# Patient Record
Sex: Male | Born: 1950 | Race: White | Hispanic: No | Marital: Married | Smoking: Never smoker
Health system: Southern US, Community
[De-identification: ages and names within clinical notes are randomized; demographics above are authoritative.]

## PROBLEM LIST (undated history)

## (undated) DIAGNOSIS — I10 Essential (primary) hypertension: Secondary | ICD-10-CM

## (undated) DIAGNOSIS — G4700 Insomnia, unspecified: Secondary | ICD-10-CM

## (undated) DIAGNOSIS — M1990 Unspecified osteoarthritis, unspecified site: Secondary | ICD-10-CM

## (undated) DIAGNOSIS — K219 Gastro-esophageal reflux disease without esophagitis: Secondary | ICD-10-CM

## (undated) DIAGNOSIS — K2270 Barrett's esophagus without dysplasia: Secondary | ICD-10-CM

## (undated) DIAGNOSIS — J302 Other seasonal allergic rhinitis: Secondary | ICD-10-CM

## (undated) DIAGNOSIS — F32A Depression, unspecified: Secondary | ICD-10-CM

## (undated) DIAGNOSIS — F329 Major depressive disorder, single episode, unspecified: Secondary | ICD-10-CM

## (undated) HISTORY — PX: UPPER GI ENDOSCOPY: SHX6162

## (undated) HISTORY — PX: BUNIONECTOMY: SHX129

## (undated) HISTORY — PX: COLONOSCOPY: SHX174

---

## 1993-12-02 HISTORY — PX: ELBOW ARTHROSCOPY: SHX614

## 2000-09-01 ENCOUNTER — Ambulatory Visit (HOSPITAL_COMMUNITY): Admission: RE | Admit: 2000-09-01 | Discharge: 2000-09-01 | Payer: Self-pay | Admitting: *Deleted

## 2003-11-24 ENCOUNTER — Ambulatory Visit (HOSPITAL_COMMUNITY): Admission: RE | Admit: 2003-11-24 | Discharge: 2003-11-24 | Payer: Self-pay | Admitting: *Deleted

## 2005-07-11 ENCOUNTER — Ambulatory Visit: Admission: RE | Admit: 2005-07-11 | Discharge: 2005-07-11 | Payer: Self-pay | Admitting: Internal Medicine

## 2005-07-16 ENCOUNTER — Emergency Department (HOSPITAL_COMMUNITY): Admission: EM | Admit: 2005-07-16 | Discharge: 2005-07-16 | Payer: Self-pay | Admitting: Emergency Medicine

## 2005-07-16 ENCOUNTER — Inpatient Hospital Stay (HOSPITAL_COMMUNITY): Admission: AD | Admit: 2005-07-16 | Discharge: 2005-07-18 | Payer: Self-pay | Admitting: Internal Medicine

## 2005-07-16 ENCOUNTER — Ambulatory Visit: Payer: Self-pay | Admitting: Pulmonary Disease

## 2006-01-22 ENCOUNTER — Ambulatory Visit: Payer: Self-pay | Admitting: Oncology

## 2006-12-30 IMAGING — CR DG CHEST 2V
2 series · 2 of 2 positions shown · non-contrast
Comparison: There are no prior studies currently available for comparison.

CLINICAL DATA: Increased shortness of breath.  Pain with inspiration.  
 CHEST ? TWO VIEW:

[w chest pa]
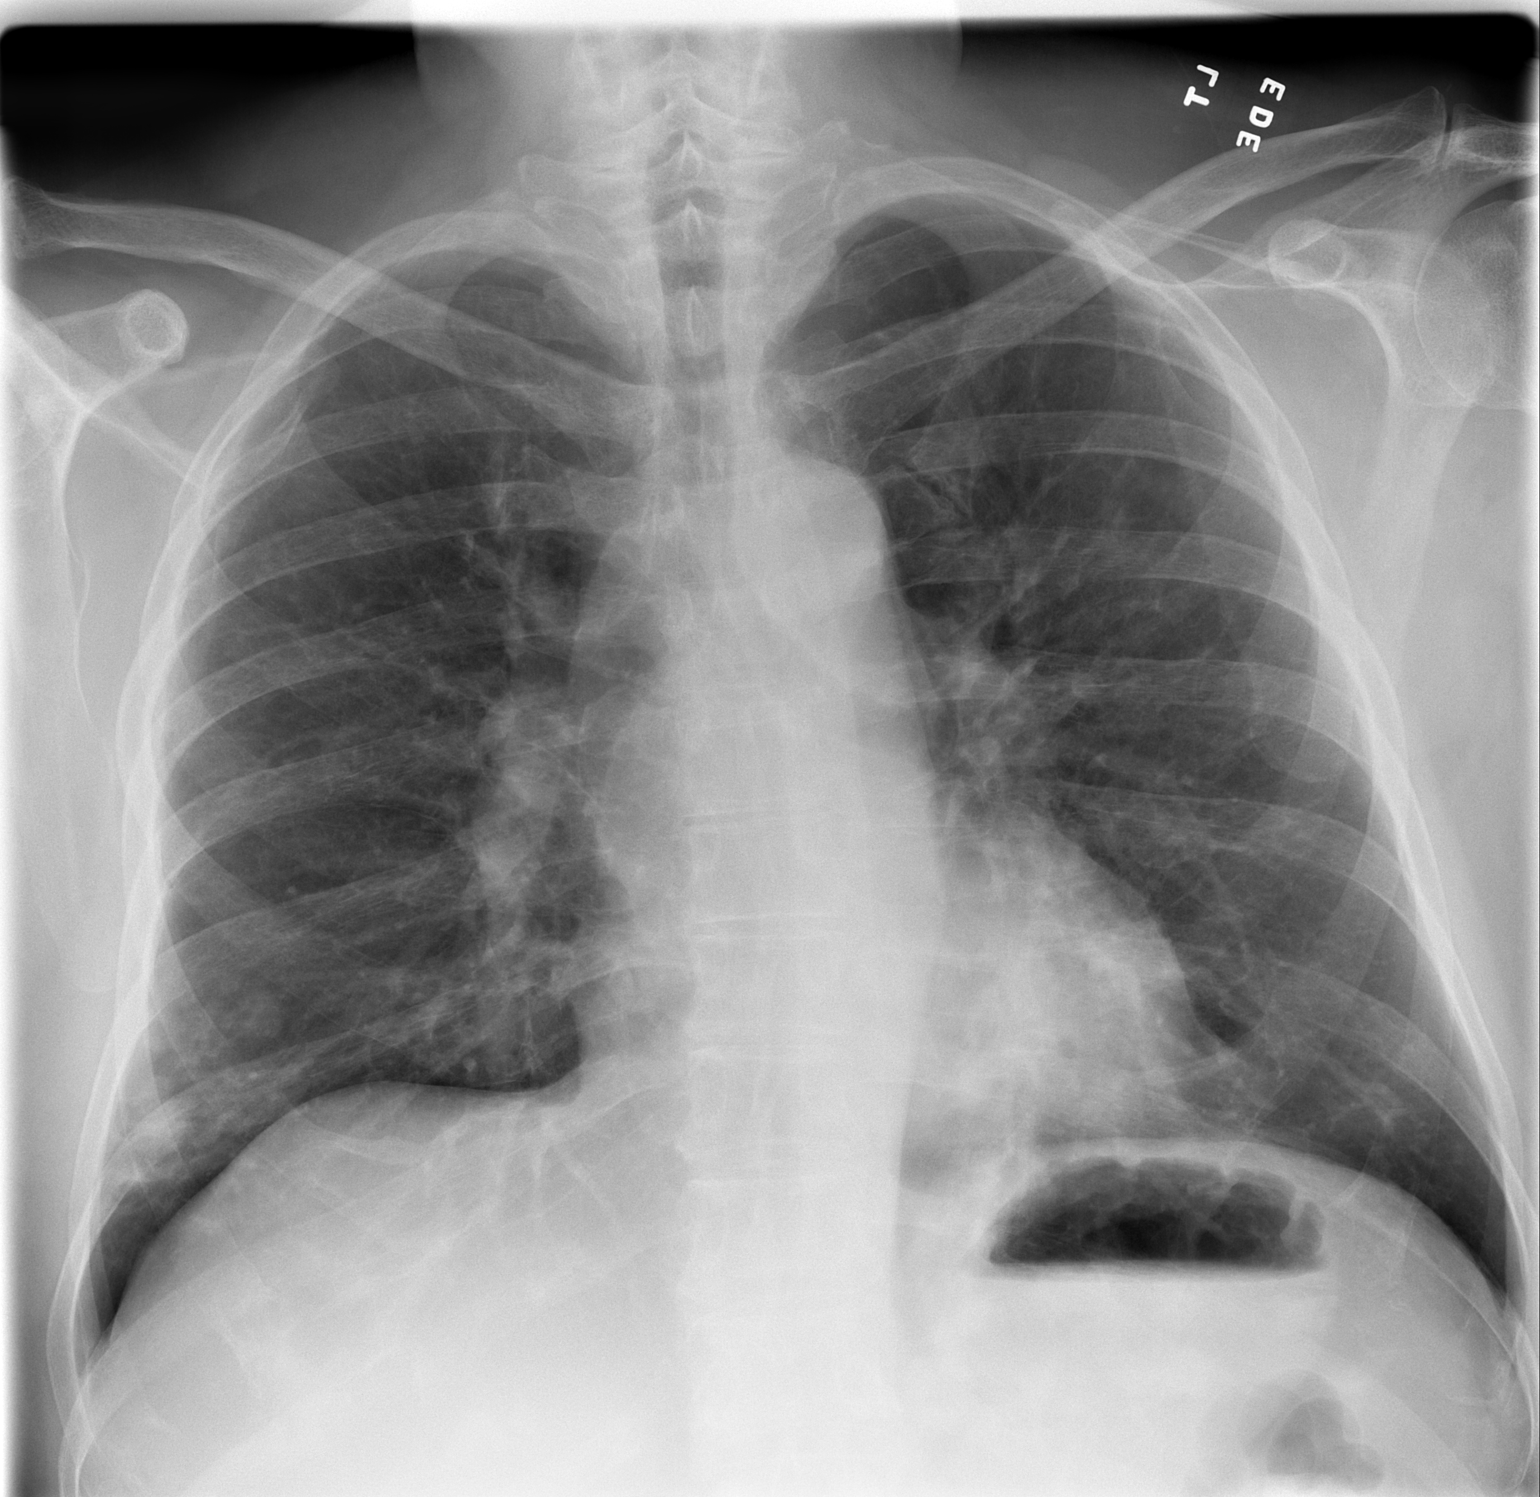

[w chest lat]
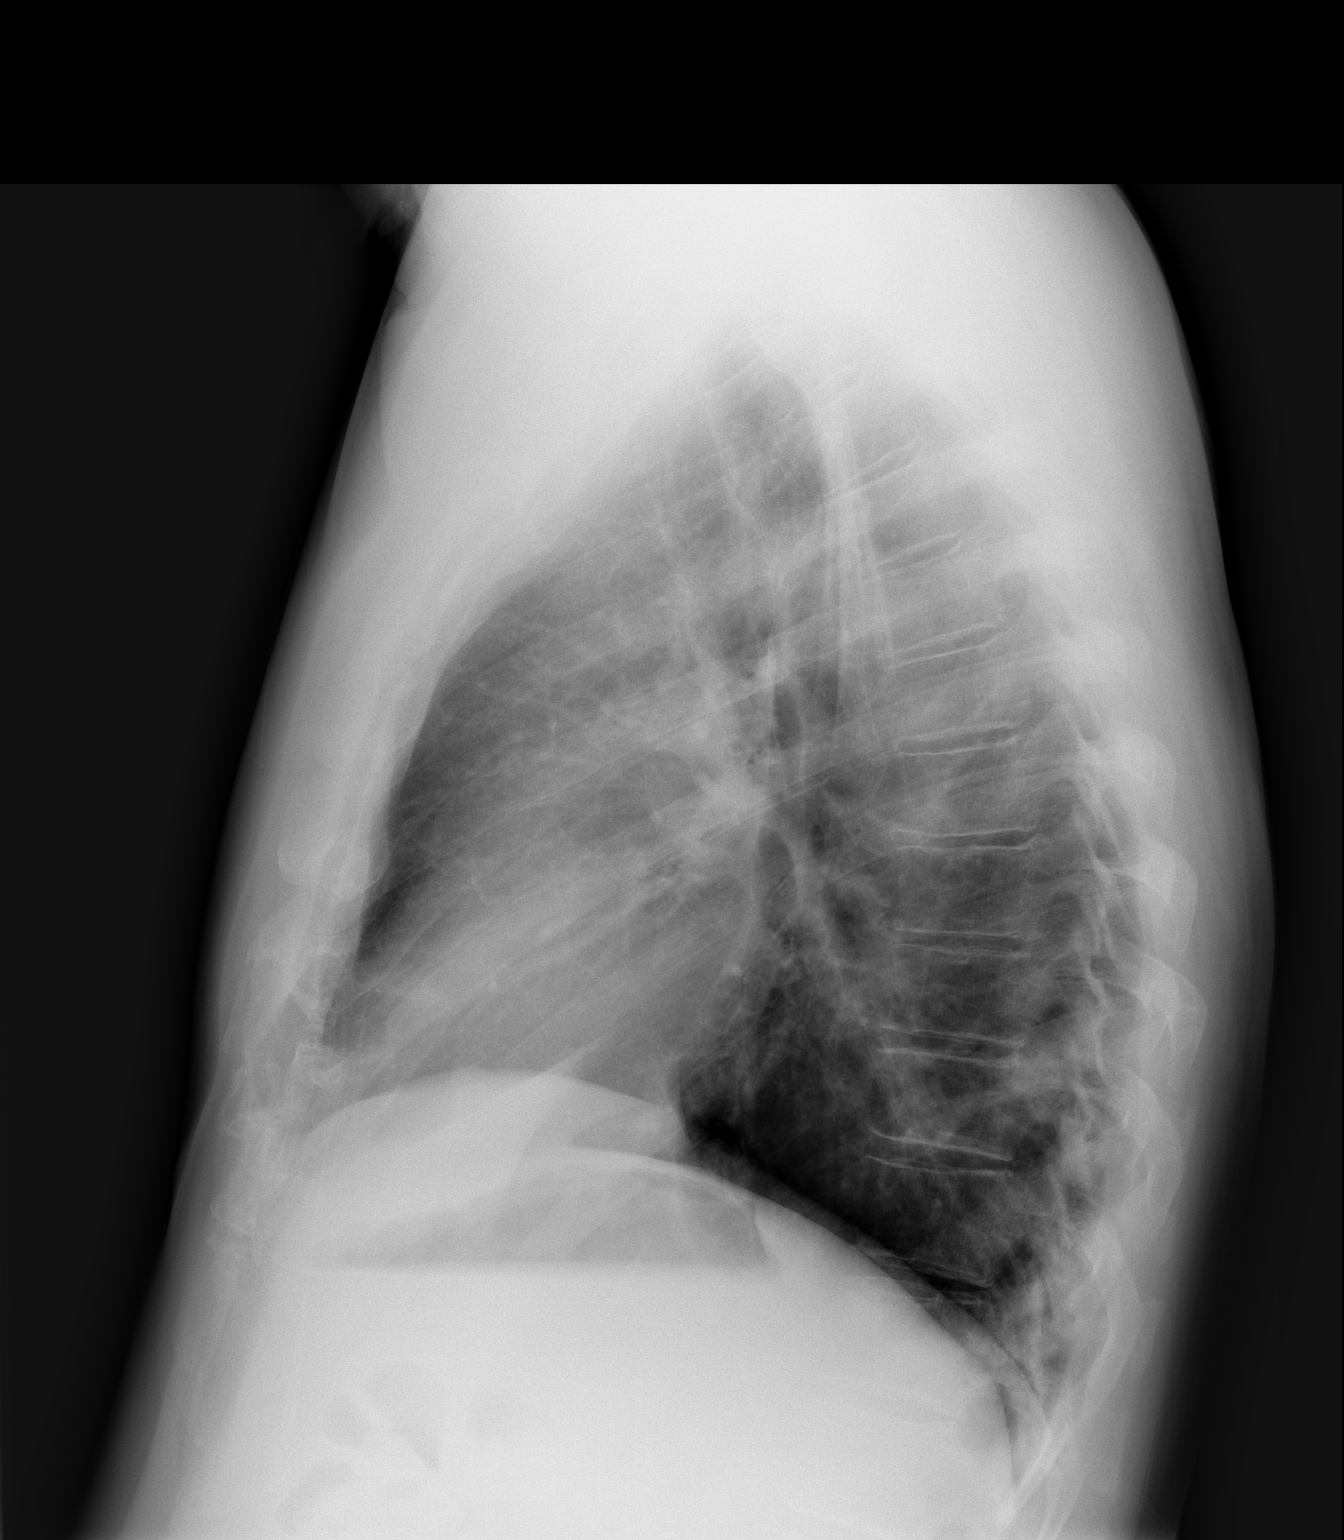

[2 of 2 positions shown; findings below may reference images not displayed]

There are multiple nodular densities seen within the right lung base laterally with adjacent patchy infiltrate.  There is also an infiltrate seen within the left lower lobe in the retrocardiac region.  The right hilum appears slightly prominent.  The heart is upper normal in size.  There is fullness noted in the subcarinal region.  Chest CT scan would be helpful for further evaluation.
IMPRESSION: Multiple small nodules within the right lung base laterally.  There are also bilateral patchy lower lobe infiltrates and questionable fullness within the subcarinal region and right hilum.  Chest CT scan recommended for further evaluation.

## 2012-12-02 HISTORY — PX: FINGER DEBRIDEMENT: SHX1634

## 2013-12-22 ENCOUNTER — Other Ambulatory Visit: Payer: Self-pay | Admitting: Orthopedic Surgery

## 2013-12-22 ENCOUNTER — Encounter (HOSPITAL_BASED_OUTPATIENT_CLINIC_OR_DEPARTMENT_OTHER): Payer: Self-pay | Admitting: *Deleted

## 2013-12-22 NOTE — Progress Notes (Signed)
To come in for ekg-bmethx pneumonia 2006-never smoked-no resp problems

## 2013-12-23 ENCOUNTER — Other Ambulatory Visit: Payer: Self-pay

## 2013-12-23 ENCOUNTER — Encounter (HOSPITAL_BASED_OUTPATIENT_CLINIC_OR_DEPARTMENT_OTHER)
Admission: RE | Admit: 2013-12-23 | Discharge: 2013-12-23 | Disposition: A | Discharge status: Home / Self Care | Payer: BC Managed Care – PPO | Source: Ambulatory Visit | Attending: Orthopedic Surgery | Admitting: Orthopedic Surgery

## 2013-12-23 DIAGNOSIS — Z01810 Encounter for preprocedural cardiovascular examination: Secondary | ICD-10-CM | POA: Insufficient documentation

## 2013-12-23 DIAGNOSIS — Z01818 Encounter for other preprocedural examination: Secondary | ICD-10-CM | POA: Insufficient documentation

## 2013-12-23 LAB — BASIC METABOLIC PANEL
BUN: 15 mg/dL (ref 6–23)
CALCIUM: 9.8 mg/dL (ref 8.4–10.5)
CO2: 24 mEq/L (ref 19–32)
Chloride: 103 mEq/L (ref 96–112)
Creatinine, Ser: 0.68 mg/dL (ref 0.50–1.35)
GLUCOSE: 117 mg/dL — AB (ref 70–99)
Potassium: 5 mEq/L (ref 3.7–5.3)
Sodium: 140 mEq/L (ref 137–147)

## 2013-12-23 NOTE — Progress Notes (Signed)
ekg cleared by dr Sampson Goonfitzgerald

## 2013-12-24 ENCOUNTER — Other Ambulatory Visit: Payer: Self-pay | Admitting: Orthopedic Surgery

## 2013-12-27 NOTE — H&P (Signed)
Jacob Wells is an 63 y.o. male.   Chief Complaint: . He presents c/o a chronically painful left shoulder.  HPI: . He presents for evaluation of a chronically painful left shoulder. He noted to Dr. Stanford ScotlandWarburton that he had significant left shoulder pain that was very sharp and intermittent. Dr. Stanford ScotlandWarburton ordered an MRI which revealed a type II SLAP lesion, AC arthropathy with severe impingement of his distal clavicle osteophyte on the supraspinatus rotator cuff tendon and cystic change at the insertion of the supraspinatus. He has a partial thickness PASTA/PITA tear of his supraspinatus tendon noted. He was advised by Dr. Stanford ScotlandWarburton to live with his symptoms for the time being. He seeks a 2nd opinion with our practice as he is well acquainted with our office.   Past Medical History  Diagnosis Date  . Hypertension   . GERD (gastroesophageal reflux disease)   . Barrett esophagus   . Seasonal allergies   . Insomnia   . Depression   . Arthritis     Past Surgical History  Procedure Laterality Date  . Finger debridement  2014    I/D right index MRSA  . Elbow arthroscopy  1995    right  . Bunionectomy      both feet  . Colonoscopy    . Upper gi endoscopy      History reviewed. No pertinent family history. Social History:  reports that he has never smoked. He does not have any smokeless tobacco history on file. He reports that he does not drink alcohol or use illicit drugs.  Allergies: No Known Allergies  No prescriptions prior to admission    No results found for this or any previous visit (from the past 48 hour(s)).  No results found.   Pertinent items are noted in HPI.  Height 6' (1.829 m), weight 88.451 kg (195 lb).  General appearance: alert Head: Normocephalic, without obvious abnormality Neck: supple, symmetrical, trachea midline Resp: clear to auscultation bilaterally Cardio: regular rate and rhythm GI: normal findings: bowel sounds normal Extremities:  He has  near full ROM of his left shoulder. He has elevation right shoulder 175, left shoulder 170, external rotation right shoulder 95 degrees at 90 degrees abduction and 90 degrees on the left. His internal rotation right shoulder is T8 and left shoulder T8. He can extend his interscapular plane bilaterally. He has pain with rapid abduction, pain with cross torso adduction, pain with Hawkins maneuver and painful Speeds test. Traction on the long head of the biceps is painful.   We reviewed AP, lateral and outlet films in our office which demonstrated a type III acromion, marked narrowing of the [facility] joint, and an inferior projecting osteophyte at the distal clavicle.   I reviewed his MRI pointing out his SLAP lesion, his advanced arthritis at the [facility] joint and cystic change and a partial thickness rotator cuff tear at the supraspinatus insertion.    Pulses: 2+ and symmetric Skin: normal Neurologic: Grossly normal    Assessment/Plan Impression: Chronic and progressive left shoulder pain with impingement.  Plan: To the OR for left SA with SAD/DCR and possible biceps tenotomy and RC repair.The procedure, risks,benefits and post-op course were discussed with the patient at length and they were in agreement with the plan.  Jacob Wells 12/27/2013, 10:27 AM  H&P documentation: 12/28/2013  -History and Physical Reviewed  -Patient has been re-examined  -No change in the plan of care  Jacob Forsterobert V Katharyn Schauer Jr, MD

## 2013-12-28 ENCOUNTER — Encounter (HOSPITAL_BASED_OUTPATIENT_CLINIC_OR_DEPARTMENT_OTHER): Payer: Self-pay

## 2013-12-28 ENCOUNTER — Ambulatory Visit (HOSPITAL_BASED_OUTPATIENT_CLINIC_OR_DEPARTMENT_OTHER): Payer: BC Managed Care – PPO | Admitting: Anesthesiology

## 2013-12-28 ENCOUNTER — Encounter (HOSPITAL_BASED_OUTPATIENT_CLINIC_OR_DEPARTMENT_OTHER): Payer: BC Managed Care – PPO | Admitting: Anesthesiology

## 2013-12-28 ENCOUNTER — Ambulatory Visit (HOSPITAL_BASED_OUTPATIENT_CLINIC_OR_DEPARTMENT_OTHER)
Admission: RE | Admit: 2013-12-28 | Discharge: 2013-12-29 | Disposition: A | Discharge status: Home / Self Care | Payer: BC Managed Care – PPO | Source: Ambulatory Visit | Attending: Orthopedic Surgery | Admitting: Orthopedic Surgery

## 2013-12-28 ENCOUNTER — Encounter (HOSPITAL_BASED_OUTPATIENT_CLINIC_OR_DEPARTMENT_OTHER)
Admission: RE | Disposition: A | Discharge status: Home / Self Care | Payer: Self-pay | Source: Ambulatory Visit | Attending: Orthopedic Surgery

## 2013-12-28 DIAGNOSIS — M898X9 Other specified disorders of bone, unspecified site: Secondary | ICD-10-CM | POA: Insufficient documentation

## 2013-12-28 DIAGNOSIS — S43429A Sprain of unspecified rotator cuff capsule, initial encounter: Secondary | ICD-10-CM | POA: Insufficient documentation

## 2013-12-28 DIAGNOSIS — M67919 Unspecified disorder of synovium and tendon, unspecified shoulder: Secondary | ICD-10-CM | POA: Insufficient documentation

## 2013-12-28 DIAGNOSIS — I10 Essential (primary) hypertension: Secondary | ICD-10-CM | POA: Insufficient documentation

## 2013-12-28 DIAGNOSIS — M75120 Complete rotator cuff tear or rupture of unspecified shoulder, not specified as traumatic: Secondary | ICD-10-CM | POA: Diagnosis present

## 2013-12-28 DIAGNOSIS — F329 Major depressive disorder, single episode, unspecified: Secondary | ICD-10-CM | POA: Insufficient documentation

## 2013-12-28 DIAGNOSIS — K2270 Barrett's esophagus without dysplasia: Secondary | ICD-10-CM | POA: Insufficient documentation

## 2013-12-28 DIAGNOSIS — F3289 Other specified depressive episodes: Secondary | ICD-10-CM | POA: Insufficient documentation

## 2013-12-28 DIAGNOSIS — S46819A Strain of other muscles, fascia and tendons at shoulder and upper arm level, unspecified arm, initial encounter: Secondary | ICD-10-CM | POA: Insufficient documentation

## 2013-12-28 DIAGNOSIS — M19019 Primary osteoarthritis, unspecified shoulder: Secondary | ICD-10-CM | POA: Insufficient documentation

## 2013-12-28 DIAGNOSIS — M6580 Other synovitis and tenosynovitis, unspecified site: Secondary | ICD-10-CM | POA: Insufficient documentation

## 2013-12-28 DIAGNOSIS — Z9109 Other allergy status, other than to drugs and biological substances: Secondary | ICD-10-CM | POA: Insufficient documentation

## 2013-12-28 DIAGNOSIS — X58XXXA Exposure to other specified factors, initial encounter: Secondary | ICD-10-CM | POA: Insufficient documentation

## 2013-12-28 DIAGNOSIS — M719 Bursopathy, unspecified: Secondary | ICD-10-CM | POA: Insufficient documentation

## 2013-12-28 DIAGNOSIS — K219 Gastro-esophageal reflux disease without esophagitis: Secondary | ICD-10-CM | POA: Insufficient documentation

## 2013-12-28 DIAGNOSIS — S43439A Superior glenoid labrum lesion of unspecified shoulder, initial encounter: Secondary | ICD-10-CM | POA: Insufficient documentation

## 2013-12-28 HISTORY — DX: Major depressive disorder, single episode, unspecified: F32.9

## 2013-12-28 HISTORY — DX: Other seasonal allergic rhinitis: J30.2

## 2013-12-28 HISTORY — DX: Essential (primary) hypertension: I10

## 2013-12-28 HISTORY — DX: Unspecified osteoarthritis, unspecified site: M19.90

## 2013-12-28 HISTORY — DX: Gastro-esophageal reflux disease without esophagitis: K21.9

## 2013-12-28 HISTORY — DX: Depression, unspecified: F32.A

## 2013-12-28 HISTORY — PX: SHOULDER ARTHROSCOPY WITH BICEPS TENDON REPAIR: SHX5674

## 2013-12-28 HISTORY — DX: Barrett's esophagus without dysplasia: K22.70

## 2013-12-28 HISTORY — DX: Insomnia, unspecified: G47.00

## 2013-12-28 LAB — POCT HEMOGLOBIN-HEMACUE: HEMOGLOBIN: 16.2 g/dL (ref 13.0–17.0)

## 2013-12-28 SURGERY — SHOULDER ARTHROSCOPY WITH BICEPS TENDON REPAIR
Anesthesia: General | Site: Shoulder | Laterality: Left

## 2013-12-28 MED ORDER — FENTANYL CITRATE 0.05 MG/ML IJ SOLN: 50.0000 ug | INTRAMUSCULAR | Status: DC | PRN

## 2013-12-28 MED ORDER — ONDANSETRON HCL 4 MG/2ML IJ SOLN
INTRAMUSCULAR | Status: DC | PRN
  Administered 2013-12-28: 4 mg via INTRAVENOUS

## 2013-12-28 MED ORDER — CEFAZOLIN SODIUM-DEXTROSE 2-3 GM-% IV SOLR
2.0000 g | Freq: Four times a day (QID) | INTRAVENOUS | Status: AC
  Administered 2013-12-28 – 2013-12-29 (×3): 2 g via INTRAVENOUS

## 2013-12-28 MED ORDER — ZOLPIDEM TARTRATE 10 MG PO TABS
10.0000 mg | ORAL_TABLET | Freq: Every evening | ORAL | Status: DC | PRN
  Administered 2013-12-28: 10 mg via ORAL

## 2013-12-28 MED ORDER — METHOCARBAMOL 500 MG PO TABS: 500.0000 mg | ORAL_TABLET | Freq: Four times a day (QID) | ORAL | Status: DC | PRN

## 2013-12-28 MED ORDER — MIDAZOLAM HCL 2 MG/2ML IJ SOLN
INTRAMUSCULAR | Status: AC
  Filled 2013-12-28: qty 2

## 2013-12-28 MED ORDER — METOCLOPRAMIDE HCL 5 MG/ML IJ SOLN
5.0000 mg | Freq: Three times a day (TID) | INTRAMUSCULAR | Status: DC | PRN
Start: 2013-12-28 — End: 2013-12-29

## 2013-12-28 MED ORDER — SUCCINYLCHOLINE CHLORIDE 20 MG/ML IJ SOLN
INTRAMUSCULAR | Status: DC | PRN
  Administered 2013-12-28: 100 mg via INTRAVENOUS

## 2013-12-28 MED ORDER — OXYCODONE HCL 5 MG/5ML PO SOLN: 5.0000 mg | Freq: Once | ORAL | Status: DC | PRN

## 2013-12-28 MED ORDER — FENTANYL CITRATE 0.05 MG/ML IJ SOLN
50.0000 ug | Freq: Once | INTRAMUSCULAR | Status: AC
  Administered 2013-12-28: 100 ug via INTRAVENOUS

## 2013-12-28 MED ORDER — HYDROMORPHONE HCL PF 1 MG/ML IJ SOLN
INTRAMUSCULAR | Status: AC
  Filled 2013-12-28: qty 1

## 2013-12-28 MED ORDER — CEFAZOLIN SODIUM-DEXTROSE 2-3 GM-% IV SOLR
INTRAVENOUS | Status: AC
  Filled 2013-12-28: qty 50

## 2013-12-28 MED ORDER — MIDAZOLAM HCL 2 MG/2ML IJ SOLN: 1.0000 mg | INTRAMUSCULAR | Status: DC | PRN

## 2013-12-28 MED ORDER — CEFAZOLIN SODIUM-DEXTROSE 2-3 GM-% IV SOLR
2.0000 g | Freq: Once | INTRAVENOUS | Status: AC
  Administered 2013-12-28: 2 g via INTRAVENOUS

## 2013-12-28 MED ORDER — METOPROLOL SUCCINATE ER 50 MG PO TB24
50.0000 mg | ORAL_TABLET | Freq: Every day | ORAL | Status: DC
  Administered 2013-12-28: 50 mg via ORAL

## 2013-12-28 MED ORDER — SODIUM CHLORIDE 0.9 % IV SOLN
INTRAVENOUS | Status: DC
  Administered 2013-12-28: 20 mL/h via INTRAVENOUS

## 2013-12-28 MED ORDER — SODIUM CHLORIDE 0.9 % IR SOLN
Status: DC | PRN
  Administered 2013-12-28: 17000 mL

## 2013-12-28 MED ORDER — OXYCODONE-ACETAMINOPHEN 5-325 MG PO TABS
1.0000 | ORAL_TABLET | ORAL | Status: DC | PRN
  Administered 2013-12-28 – 2013-12-29 (×3): 2 via ORAL
  Filled 2013-12-28 (×3): qty 2

## 2013-12-28 MED ORDER — FENTANYL CITRATE 0.05 MG/ML IJ SOLN
INTRAMUSCULAR | Status: AC
  Filled 2013-12-28: qty 2

## 2013-12-28 MED ORDER — METHOCARBAMOL 100 MG/ML IJ SOLN: 500.0000 mg | Freq: Four times a day (QID) | INTRAVENOUS | Status: DC | PRN

## 2013-12-28 MED ORDER — FENTANYL CITRATE 0.05 MG/ML IJ SOLN
INTRAMUSCULAR | Status: AC
  Filled 2013-12-28: qty 4

## 2013-12-28 MED ORDER — MIDAZOLAM HCL 5 MG/5ML IJ SOLN
INTRAMUSCULAR | Status: DC | PRN
  Administered 2013-12-28 (×2): 1 mg via INTRAVENOUS

## 2013-12-28 MED ORDER — ONDANSETRON HCL 4 MG/2ML IJ SOLN: 4.0000 mg | Freq: Four times a day (QID) | INTRAMUSCULAR | Status: DC | PRN

## 2013-12-28 MED ORDER — PROPOFOL 10 MG/ML IV BOLUS
INTRAVENOUS | Status: DC | PRN
  Administered 2013-12-28: 200 mg via INTRAVENOUS

## 2013-12-28 MED ORDER — CEPHALEXIN 500 MG PO CAPS: 500.0000 mg | ORAL_CAPSULE | Freq: Three times a day (TID) | ORAL | Status: DC

## 2013-12-28 MED ORDER — DEXAMETHASONE SODIUM PHOSPHATE 4 MG/ML IJ SOLN
INTRAMUSCULAR | Status: DC | PRN
  Administered 2013-12-28: 10 mg via INTRAVENOUS

## 2013-12-28 MED ORDER — FENTANYL CITRATE 0.05 MG/ML IJ SOLN
INTRAMUSCULAR | Status: DC | PRN
  Administered 2013-12-28 (×2): 50 ug via INTRAVENOUS

## 2013-12-28 MED ORDER — LIDOCAINE HCL (CARDIAC) 20 MG/ML IV SOLN
INTRAVENOUS | Status: DC | PRN
  Administered 2013-12-28: 100 mg via INTRAVENOUS

## 2013-12-28 MED ORDER — PROPOFOL 10 MG/ML IV BOLUS
INTRAVENOUS | Status: AC
  Filled 2013-12-28: qty 20

## 2013-12-28 MED ORDER — OXYCODONE HCL 5 MG PO TABS: 5.0000 mg | ORAL_TABLET | Freq: Once | ORAL | Status: DC | PRN

## 2013-12-28 MED ORDER — CHLORHEXIDINE GLUCONATE 4 % EX LIQD: 60.0000 mL | Freq: Once | CUTANEOUS | Status: DC

## 2013-12-28 MED ORDER — DEXAMETHASONE SODIUM PHOSPHATE 10 MG/ML IJ SOLN
INTRAMUSCULAR | Status: DC | PRN
  Administered 2013-12-28: 4 mg

## 2013-12-28 MED ORDER — HYDROMORPHONE HCL PF 1 MG/ML IJ SOLN
0.2500 mg | INTRAMUSCULAR | Status: DC | PRN
  Administered 2013-12-28 (×4): 0.5 mg via INTRAVENOUS

## 2013-12-28 MED ORDER — BUPIVACAINE-EPINEPHRINE PF 0.5-1:200000 % IJ SOLN
INTRAMUSCULAR | Status: DC | PRN
  Administered 2013-12-28: 25 mL

## 2013-12-28 MED ORDER — LACTATED RINGERS IV SOLN
INTRAVENOUS | Status: DC
  Administered 2013-12-28 (×2): via INTRAVENOUS

## 2013-12-28 MED ORDER — HYDROMORPHONE HCL PF 1 MG/ML IJ SOLN
0.5000 mg | INTRAMUSCULAR | Status: DC | PRN
  Administered 2013-12-28: 0.5 mg via INTRAVENOUS
  Filled 2013-12-28: qty 1

## 2013-12-28 MED ORDER — SUCCINYLCHOLINE CHLORIDE 20 MG/ML IJ SOLN
INTRAMUSCULAR | Status: AC
Start: 2013-12-28 — End: 2013-12-28
  Filled 2013-12-28: qty 1

## 2013-12-28 MED ORDER — METOCLOPRAMIDE HCL 5 MG PO TABS: 5.0000 mg | ORAL_TABLET | Freq: Three times a day (TID) | ORAL | Status: DC | PRN

## 2013-12-28 MED ORDER — LORATADINE 10 MG PO TABS
10.0000 mg | ORAL_TABLET | Freq: Every day | ORAL | Status: DC
  Administered 2013-12-28: 10 mg via ORAL

## 2013-12-28 MED ORDER — HYDROMORPHONE HCL 2 MG PO TABS: ORAL_TABLET | ORAL | Status: DC

## 2013-12-28 MED ORDER — LAMOTRIGINE 200 MG PO TABS
200.0000 mg | ORAL_TABLET | Freq: Every day | ORAL | Status: DC
  Administered 2013-12-28: 200 mg via ORAL

## 2013-12-28 MED ORDER — ONDANSETRON HCL 4 MG PO TABS: 4.0000 mg | ORAL_TABLET | Freq: Four times a day (QID) | ORAL | Status: DC | PRN

## 2013-12-28 MED ORDER — EPHEDRINE SULFATE 50 MG/ML IJ SOLN
INTRAMUSCULAR | Status: DC | PRN
  Administered 2013-12-28: 15 mg via INTRAVENOUS

## 2013-12-28 MED ORDER — MIDAZOLAM HCL 2 MG/2ML IJ SOLN
1.0000 mg | INTRAMUSCULAR | Status: DC | PRN
  Administered 2013-12-28: 2 mg via INTRAVENOUS

## 2013-12-28 MED ORDER — PROMETHAZINE HCL 25 MG/ML IJ SOLN: 6.2500 mg | INTRAMUSCULAR | Status: DC | PRN

## 2013-12-28 MED ORDER — CHLORHEXIDINE GLUCONATE 4 % EX LIQD
60.0000 mL | Freq: Once | CUTANEOUS | Status: DC
Start: 2013-12-28 — End: 2013-12-28

## 2013-12-28 MED ORDER — ZOLPIDEM TARTRATE 5 MG PO TABS
ORAL_TABLET | ORAL | Status: AC
  Filled 2013-12-28: qty 2

## 2013-12-28 SURGICAL SUPPLY — 78 items
ANCHOR PEEK 4.75X19.1 SWLK C (Anchor) ×3 IMPLANT
BANDAGE ADH SHEER 1  50/CT (GAUZE/BANDAGES/DRESSINGS) IMPLANT
BLADE AVERAGE 25MMX9MM (BLADE)
BLADE AVERAGE 25X9 (BLADE) IMPLANT
BLADE CUTTER MENIS 5.5 (BLADE) ×3 IMPLANT
BLADE SURG 15 STRL LF DISP TIS (BLADE) ×2 IMPLANT
BLADE SURG 15 STRL SS (BLADE) ×4
BUR EGG/OVAL CARBIDE (BURR) IMPLANT
BUR OVAL 6.0 (BURR) ×3 IMPLANT
CANISTER SUCT 3000ML (MISCELLANEOUS) IMPLANT
CANISTER SUCT LVC 12 LTR MEDI- (MISCELLANEOUS) IMPLANT
CANNULA TWIST IN 8.25X7CM (CANNULA) ×3 IMPLANT
CLEANER CAUTERY TIP 5X5 PAD (MISCELLANEOUS) IMPLANT
CLOSURE WOUND 1/2 X4 (GAUZE/BANDAGES/DRESSINGS) ×1
CUTTER MENISCUS  4.2MM (BLADE)
CUTTER MENISCUS 4.2MM (BLADE) IMPLANT
DECANTER SPIKE VIAL GLASS SM (MISCELLANEOUS) IMPLANT
DRAPE INCISE IOBAN 66X45 STRL (DRAPES) ×3 IMPLANT
DRAPE STERI 35X30 U-POUCH (DRAPES) ×3 IMPLANT
DRAPE SURG 17X23 STRL (DRAPES) ×3 IMPLANT
DRAPE U-SHAPE 47X51 STRL (DRAPES) ×3 IMPLANT
DRAPE U-SHAPE 76X120 STRL (DRAPES) ×6 IMPLANT
DURAPREP 26ML APPLICATOR (WOUND CARE) ×3 IMPLANT
ELECT REM PT RETURN 9FT ADLT (ELECTROSURGICAL) ×3
ELECTRODE REM PT RTRN 9FT ADLT (ELECTROSURGICAL) ×1 IMPLANT
GLOVE BIOGEL M STRL SZ7.5 (GLOVE) ×3 IMPLANT
GLOVE BIOGEL PI IND STRL 7.0 (GLOVE) ×2 IMPLANT
GLOVE BIOGEL PI IND STRL 8 (GLOVE) ×2 IMPLANT
GLOVE BIOGEL PI INDICATOR 7.0 (GLOVE) ×4
GLOVE BIOGEL PI INDICATOR 8 (GLOVE) ×4
GLOVE ORTHO TXT STRL SZ7.5 (GLOVE) ×3 IMPLANT
GOWN STRL REUS W/ TWL LRG LVL3 (GOWN DISPOSABLE) ×1 IMPLANT
GOWN STRL REUS W/TWL LRG LVL3 (GOWN DISPOSABLE) ×2
GOWN STRL REUS W/TWL XL LVL4 (GOWN DISPOSABLE) ×6 IMPLANT
MANIFOLD NEPTUNE II (INSTRUMENTS) ×3 IMPLANT
NDL SUT 6 .5 CRC .975X.05 MAYO (NEEDLE) IMPLANT
NEEDLE MAYO TAPER (NEEDLE)
NEEDLE MINI RC 24MM (NEEDLE) IMPLANT
NEEDLE SCORPION (NEEDLE) ×3 IMPLANT
PACK ARTHROSCOPY DSU (CUSTOM PROCEDURE TRAY) ×3 IMPLANT
PACK BASIN DAY SURGERY FS (CUSTOM PROCEDURE TRAY) ×3 IMPLANT
PAD ABD 8X10 STRL (GAUZE/BANDAGES/DRESSINGS) ×3 IMPLANT
PAD CLEANER CAUTERY TIP 5X5 (MISCELLANEOUS)
PASSER SUT SWANSON 36MM LOOP (INSTRUMENTS) IMPLANT
PENCIL BUTTON HOLSTER BLD 10FT (ELECTRODE) IMPLANT
SLEEVE SCD COMPRESS KNEE MED (MISCELLANEOUS) ×3 IMPLANT
SLING ARM LRG ADULT FOAM STRAP (SOFTGOODS) IMPLANT
SLING ARM MED ADULT FOAM STRAP (SOFTGOODS) IMPLANT
SLING ARM XL FOAM STRAP (SOFTGOODS) ×3 IMPLANT
SPONGE GAUZE 4X4 12PLY (GAUZE/BANDAGES/DRESSINGS) ×3 IMPLANT
SPONGE LAP 4X18 X RAY DECT (DISPOSABLE) IMPLANT
STRIP CLOSURE SKIN 1/2X4 (GAUZE/BANDAGES/DRESSINGS) ×2 IMPLANT
SUCTION FRAZIER TIP 10 FR DISP (SUCTIONS) IMPLANT
SUT FIBERWIRE #2 38 T-5 BLUE (SUTURE)
SUT FIBERWIRE 3-0 18 TAPR NDL (SUTURE)
SUT PROLENE 1 CT (SUTURE) IMPLANT
SUT PROLENE 3 0 PS 2 (SUTURE) ×6 IMPLANT
SUT TIGER TAPE 7 IN WHITE (SUTURE) ×3 IMPLANT
SUT VIC AB 0 CT1 27 (SUTURE)
SUT VIC AB 0 CT1 27XBRD ANBCTR (SUTURE) IMPLANT
SUT VIC AB 0 SH 27 (SUTURE) IMPLANT
SUT VIC AB 2-0 SH 27 (SUTURE)
SUT VIC AB 2-0 SH 27XBRD (SUTURE) IMPLANT
SUT VIC AB 3-0 SH 27 (SUTURE)
SUT VIC AB 3-0 SH 27X BRD (SUTURE) IMPLANT
SUTURE FIBERWR #2 38 T-5 BLUE (SUTURE) IMPLANT
SUTURE FIBERWR 3-0 18 TAPR NDL (SUTURE) IMPLANT
SYR 3ML 23GX1 SAFETY (SYRINGE) IMPLANT
SYR BULB 3OZ (MISCELLANEOUS) IMPLANT
TAPE FIBER 2MM 7IN #2 BLUE (SUTURE) IMPLANT
TAPE PAPER 3X10 WHT MICROPORE (GAUZE/BANDAGES/DRESSINGS) ×3 IMPLANT
TOWEL OR 17X24 6PK STRL BLUE (TOWEL DISPOSABLE) ×3 IMPLANT
TUBE CONNECTING 20'X1/4 (TUBING) ×2
TUBE CONNECTING 20X1/4 (TUBING) ×4 IMPLANT
TUBING ARTHROSCOPY IRRIG 16FT (MISCELLANEOUS) ×3 IMPLANT
WAND STAR VAC 90 (SURGICAL WAND) ×3 IMPLANT
WATER STERILE IRR 1000ML POUR (IV SOLUTION) ×3 IMPLANT
YANKAUER SUCT BULB TIP NO VENT (SUCTIONS) IMPLANT

## 2013-12-28 NOTE — Anesthesia Postprocedure Evaluation (Signed)
  Anesthesia Post-op Note  Patient: Jacob Wells  Procedure(s) Performed: Procedure(s): SHOULDER ARTHROSCOPY WITH SUBACROMIAL DECOMPRESSION,DISTAL CLAVICLE RESECTION AND SUBSCAPULARIS REPAIR (Left)  Patient Location: PACU  Anesthesia Type:GA combined with regional for post-op pain  Level of Consciousness: awake and alert   Airway and Oxygen Therapy: Patient Spontanous Breathing  Post-op Pain: mild  Post-op Assessment: Post-op Vital signs reviewed, Patient's Cardiovascular Status Stable, Respiratory Function Stable, Patent Airway, No signs of Nausea or vomiting and Pain level controlled  Post-op Vital Signs: Reviewed and stable  Complications: No apparent anesthesia complications

## 2013-12-28 NOTE — Op Note (Signed)
[id number]  

## 2013-12-28 NOTE — Progress Notes (Signed)
Assisted Dr. Gypsy BalsamKasik with left, interscalene  block. Side rails up, monitors on throughout procedure. See vital signs in flow sheet. Tolerated Procedure well.

## 2013-12-28 NOTE — Anesthesia Preprocedure Evaluation (Signed)
Anesthesia Evaluation  Patient identified by MRN, date of birth, ID band Patient awake    Reviewed: Allergy & Precautions, H&P , NPO status , Patient's Chart, lab work & pertinent test results  Airway Mallampati: I TM Distance: >3 FB Neck ROM: Full    Dental   Pulmonary  breath sounds clear to auscultation        Cardiovascular hypertension, Rhythm:Regular Rate:Normal     Neuro/Psych Depression    GI/Hepatic GERD-  ,  Endo/Other    Renal/GU      Musculoskeletal   Abdominal   Peds  Hematology   Anesthesia Other Findings   Reproductive/Obstetrics                           Anesthesia Physical Anesthesia Plan  ASA: II  Anesthesia Plan: General   Post-op Pain Management:    Induction: Intravenous  Airway Management Planned: Oral ETT  Additional Equipment:   Intra-op Plan:   Post-operative Plan: Extubation in OR  Informed Consent: I have reviewed the patients History and Physical, chart, labs and discussed the procedure including the risks, benefits and alternatives for the proposed anesthesia with the patient or authorized representative who has indicated his/her understanding and acceptance.     Plan Discussed with: CRNA and Surgeon  Anesthesia Plan Comments:         Anesthesia Quick Evaluation

## 2013-12-28 NOTE — Discharge Instructions (Signed)
[facility] Center Instructions °Hand Surgery ° °Wound Care: °Keep your hand elevated above the level of your heart.  Do not allow it to dangle by your side.  Keep the dressing dry and do not remove it unless your doctor advises you to do so.  He will usually change it at the time of your post-op visit.  Moving your fingers is advised to stimulate circulation but will depend on the site of your surgery.  If you have a splint applied, your doctor will advise you regarding movement. ° °Activity: °Do not drive or operate machinery today.  Rest today and then you may return to your normal activity and work as indicated by your physician. ° °Diet:  °Drink liquids today or eat a light diet.  You may resume a regular diet tomorrow.   ° °General expectations: °Pain for two to three days. °Fingers may become slightly swollen. ° °Call your doctor if any of the following occur: °Severe pain not relieved by pain medication. °Elevated temperature. °Dressing soaked with blood. °Inability to move fingers. °White or bluish color to fingers. ° ° °Regional Anesthesia Blocks ° °1. Numbness or the inability to move the "blocked" extremity may last from 3-48 hours after placement. The length of time depends on the medication injected and your individual response to the medication. If the numbness is not going away after 48 hours, call your surgeon. ° °2. The extremity that is blocked will need to be protected until the numbness is gone and the  Strength has returned. Because you cannot feel it, you will need to take extra care to avoid injury. Because it may be weak, you may have difficulty moving it or using it. You may not know what position it is in without looking at it while the block is in effect. ° °3. For blocks in the legs and feet, returning to weight bearing and walking needs to be done carefully. You will need to wait until the numbness is entirely gone and the strength has returned. You should be able to move your leg and foot  normally before you try and bear weight or walk. You will need someone to be with you when you first try to ensure you do not fall and possibly risk injury. ° °4. Bruising and tenderness at the needle site are common side effects and will resolve in a few days. ° °5. Persistent numbness or new problems with movement should be communicated to the surgeon or the [facility] ([phone])/ [facility] ([phone]). ° ° °Post Anesthesia Home Care Instructions ° °Activity: °Get plenty of rest for the remainder of the day. A responsible adult should stay with you for 24 hours following the procedure.  °For the next 24 hours, DO NOT: °-Drive a car °-Operate machinery °-Drink alcoholic beverages °-Take any medication unless instructed by your physician °-Make any legal decisions or sign important papers. ° °Meals: °Start with liquid foods such as gelatin or soup. Progress to regular foods as tolerated. Avoid greasy, spicy, heavy foods. If nausea and/or vomiting occur, drink only clear liquids until the nausea and/or vomiting subsides. Call your physician if vomiting continues. ° °Special Instructions/Symptoms: °Your throat may feel dry or sore from the anesthesia or the breathing tube placed in your throat during surgery. If this causes discomfort, gargle with warm salt water. The discomfort should disappear within 24 hours. ° °

## 2013-12-28 NOTE — Brief Op Note (Signed)
12/28/2013  1:11 PM  PATIENT:  Jacob Wells  63 y.o. male  PRE-OPERATIVE DIAGNOSIS:  LEFT AC DEGENERATIVE JOINT DISEASE,PARTIAL THICKNESS SUPRASPINATUS TEAR  POST-OPERATIVE DIAGNOSIS:  Left Acromioclavicular Joint Degenerative Disease PASTA TEAR 1CM X 6MM  PROCEDURE:  Procedure(s): SHOULDER ARTHROSCOPY WITH SUBACROMIAL DECOMPRESSION,DISTAL CLAVICLE RESECTION AND SUBSCAPULARIS REPAIR (Left)  SURGEON:  Surgeon(s) and Role:    * Wyn Forsterobert V Micaella Gitto Jr., MD - Primary  PHYSICIAN ASSISTANT:   ASSISTANTS: Mallory Shirkobert Dasnoit,P.A-C   ANESTHESIA:   general  EBL:  Total I/O In: 1600 [I.V.:1600] Out: -   BLOOD ADMINISTERED:none  DRAINS: none   LOCAL MEDICATIONS USED:  XYLOCAINE   SPECIMEN:  No Specimen  DISPOSITION OF SPECIMEN:  N/A  COUNTS:  YES  TOURNIQUET:  * No tourniquets in log *  DICTATION: .Other Dictation: Dictation Number [id number]  PLAN OF CARE: Admit for overnight observation  PATIENT DISPOSITION:  PACU - hemodynamically stable.   Delay start of Pharmacological VTE agent (>24hrs) due to surgical blood loss or risk of bleeding: not applicable

## 2013-12-28 NOTE — Anesthesia Procedure Notes (Addendum)
Anesthesia Regional Block:  Interscalene brachial plexus block  Pre-Anesthetic Checklist: ,, timeout performed, Correct Patient, Correct Site, Correct Laterality, Correct Procedure, Correct Position, site marked, Risks and benefits discussed,  Surgical consent,  Pre-op evaluation,  At surgeon's request and post-op pain management  Laterality: Left  Prep: chloraprep       Needles:   Needle Type: Echogenic Stimulator Needle     Needle Length: 5cm 5 cm Needle Gauge: 22 and 22 G    Additional Needles:  Procedures: ultrasound guided (picture in chart) and nerve stimulator Interscalene brachial plexus block  Nerve Stimulator or Paresthesia:  Response: 0.5 mA,   Additional Responses:   Narrative:  Start time: 12/28/2013 10:45 AM End time: 12/28/2013 10:52 AM Injection made incrementally with aspirations every 5 mL. Anesthesiologist: Dr Gypsy BalsamKasik  Additional Notes: [phone] L ISB POP CHG prep, sterile tech Single stick after sedation with good US visualization and stim down to .5ma Multiple neg asp Vernia BuffMarc .5% w/epi 1:200000 total 25cc+decadron 4mg  infiltrated No compl PIX in chart Dr Gypsy BalsamKasik   Procedure Name: Intubation Performed by: Lance CoonWEBSTER, Omak Pre-anesthesia Checklist: Patient identified, Emergency Drugs available, Suction available and Patient being monitored Patient Re-evaluated:Patient Re-evaluated prior to inductionOxygen Delivery Method: Circle System Utilized Preoxygenation: Pre-oxygenation with 100% oxygen Intubation Type: IV induction Ventilation: Mask ventilation without difficulty Laryngoscope Size: Mac and 3 Tube type: Oral Tube size: 7.0 mm Number of attempts: 1 Airway Equipment and Method: stylet and oral airway Placement Confirmation: ETT inserted through vocal cords under direct vision,  positive ETCO2 and breath sounds checked- equal and bilateral Tube secured with: Tape Dental Injury: Teeth and Oropharynx as per pre-operative assessment

## 2013-12-28 NOTE — Transfer of Care (Signed)
Immediate Anesthesia Transfer of Care Note  Patient: Jacob Wells  Procedure(s) Performed: Procedure(s): SHOULDER ARTHROSCOPY WITH SUBACROMIAL DECOMPRESSION,DISTAL CLAVICLE RESECTION AND SUBSCAPULARIS REPAIR (Left)  Patient Location: PACU  Anesthesia Type:GA combined with regional for post-op pain  Level of Consciousness: sedated  Airway & Oxygen Therapy: Patient Spontanous Breathing and Patient connected to face mask oxygen  Post-op Assessment: Report given to PACU RN and Post -op Vital signs reviewed and stable  Post vital signs: Reviewed and stable  Complications: No apparent anesthesia complications

## 2013-12-29 NOTE — Op Note (Signed)
Jacob Wells, Jacob Wells NO.:  [id number]  MEDICAL RECORD NO.:  [id number]  LOCATION:                                 FACILITY:  PHYSICIAN:  Katy Fitch. Mckaila Duffus, M.D.      DATE OF BIRTH:  DATE OF PROCEDURE:  12/28/2013 DATE OF DISCHARGE:                              OPERATIVE REPORT   PREOPERATIVE DIAGNOSES: 1. Unfavorable acromioclavicular contour due to arthritis of     acromioclavicular joint with large medial inferior projecting     osteophyte at acromion and small acromioclavicular joint inferior     projecting osteophyte. 2. MRI documented articular surface partial-thickness rotator cuff     tear with reactive bone formation at greater tuberosity. 3. Type 2 superior labrum anterior and posterior lesion, with     undermining of glenoid labrum from 11 o'clock posteriorly to 2     o'clock anteriorly. 4. Extensive subacromial bursitis, and degenerative changes of     supraspinatus rotator cuff tendon.  POSTOPERATIVE DIAGNOSES: 1. Unfavorable acromioclavicular contour due to arthritis of     acromioclavicular joint with large medial inferior projecting     osteophyte at acromion and small acromioclavicular joint inferior     projecting osteophyte. 2. MRI documented articular surface partial-thickness rotator cuff     tear with reactive bone formation at greater tuberosity. 3. Type 2 superior labrum anterior and posterior lesion, with     undermining of glenoid labrum from 11 o'clock posteriorly to 2     o'clock anteriorly. 4. Extensive subacromial bursitis, and degenerative changes of     supraspinatus rotator cuff tendon with identification of a 20%     biceps degenerative tear at the rotator interval and a grade 2     subscapularis tear at the lesser tuberosity.  OPERATION: 1. Diagnostic arthroscopy, left glenohumeral joint. 2. Arthroscopic debridement of biceps tear, type 2 SLAP lesion, and     significant synovitis. 3. Arthroscopic repair of  subscapularis grade 2 tear to decorticated     lesser tuberosity utilizing Arthrex fiber tape, and swivel lock. 4. Arthroscopic debridement of PASTA tear that was ultimately measured     to be 1 cm x 6 mm.  Due to the size of the tear in my judgment     after debridement, I do not feel it warranted a repair with either     trans tendon technique or open technique due to the fact that     represented less than 50% of the footprint of the supraspinatus     insertion. 5. Arthroscopic subacromial decompression with acromioplasty.  Limited     bursectomy. 6. Arthroscopic resection of inferior distal clavicle.  OPERATING SURGEON:  Katy Fitch. Eziah Negro, MD.  ASSISTANT:  Marveen Reeks Dasnoit, PA-C  ANESTHESIA:  General by tracheal technique supplemented by a left plexus block.  SUPERVISING ANESTHESIOLOGIST:  Dr. Gypsy Balsam.  INDICATIONS:  Jacob Wells is a 63 year old, right-hand dominant, retired gentleman who is well acquainted with our practice.  He presented for evaluation of a chronic right shoulder pain predicament.  He has had a MRI obtained at [facility] in [place], [place] on November 15, 2013.  This revealed very unfavorable  morphology of his AC joint, and a very prominent osteophyte at the medial aspect of the acromion.  He also was noted to have a type 2 SLAP lesion and some tendinopathy of the rotator cuff.  Due to his familiarity with our practice, he sought a consult regarding this predicament.  Reviewed his images in detail and noted the presence of a complex AC joint morphology due to degenerative arthritis and reviewed his plain films and MRI in detail.  He clearly had a sub AC joint impingement and signs of some degeneration of the rotator cuff with reactive bone formation at the greater tuberosity.  He had a type 2 SLAP lesion.  Due to his level of symptoms, we recommended pursuing arthroscopic evaluation of his shoulder, anticipating subacromial  decompression, distal clavicle resection whole or impart, and management of a SLAP lesion.  We advised him that we would address the pathology identified.  Due to the fact that the PASTA tear appeared to be partial, we were not clear whether or not we would simply debride this or proceed with repair, however, we did explain that in general if the PASTA tear is less than 50% of the width of the insertion at age 63, we would be more inclined to debride rather than repair.  After informed consent, he was brought to the operating room at this time.  PROCEDURE:  Jacob Wells [facility] and placed supine position on the operating table.  In the holding area, his left arm was marked per protocol with a marking pen, and Dr. Gypsy BalsamKasik had provided detailed anesthesia, informed consent.  A left interscalene block was placed without complication leading to excellent anesthesia of the left shoulder and forequarter.  In room 2 under Dr. Burnett CorrenteKasik's direct supervision, general endotracheal anesthesia was induced followed by careful positioning in the beach- chair position with aid of a torso and head holder designed for shoulder arthroscopy.  Passive compression devices were applied to the calves.  Examination of the shoulder under anesthesia revealed a stable joint without signs of adhesive capsulitis.  2 g of Ancef were administered as an IV prophylactic antibiotic followed by DuraPrep of the entire left arm and forequarter.  Impervious arthroscopy drapes and sterile stockinette were applied hence with a routine surgical time-out.  The shoulder was instrumented with a blunt trocar through a posterior viewing portal using an anterior switching stick technique.  Diagnostic arthroscopy immediately revealed 20% tear of the biceps at the entrance to the bicipital groove, and a type 2 SLAP lesion.  There is moderate degree of synovitis present.  We  identified partial-thickness rotator cuff tear PASTA in nature involving the anterior supraspinatus just posterior to the long head of the biceps.  A suction shaver was placed through the anterior portal under direct vision followed by debridement of the SLAP tear, biceps, and deep surface of the supraspinatus.  We measured the defect and noted that it was 1 cm in width and 6 mm from lateral to medial.  Given that this was less than half the footprint of the supraspinatus, we ultimately decided not to repair this.  I used a nerve hook to carefully palpate the origin of long head of the biceps.  Despite the fact that it was undermined, it was quite stable with a negative pull down test.  I could peel it back approximately 6-8 mm but felt that at age 63 with a stable origin this was within the spectrum of  normal.  The subscapularis was carefully examined and found to have a 50% superior fiber tear that had retracted.  The tear was most notable about 3 mm from the most cephalad aspect of the insertion. An anteriorsuperiorlateral portal was created and a suction shaver was used to prepare the lesser tuberosity to a bleeding bone surface.  We then placed a fiber tape with a scorpion suture passer, and through an anterior portal placed a swivel lock recreating an anatomic footprint of the upper half of the subscapularis secured to the bleeding bone surface. Photographic documentation of the subscapularis repair was accomplished with a digital camera.  We then reinspected the joint and found no evidence of significant degenerative arthritis of the humeral head, hyaline articular cartilage surface, or glenoid.  The scope was then removed from glenohumeral joint and placed in the subacromial space.  We encountered florid bursitis.  After bursectomy, the coracoacromial ligament was released, and we found the medial osteophyte was quite prominent obscuring view of the distal clavicle.  After  orienting and releasing the capsule of the [facility] joint with the cutting cautery, we then proceeded to perform a leveling of the acromion to a type 1 morphology with substantial bone removal from the medial acromion and a portion of bone being removed from the anterior acromion followed by removal of bone from the inferior aspect of the distal clavicle.  The oblique distal clavicle articulation was noted to be rather stable and given the fact that there was no significant instability in my judgment, a complete resection of distal clavicle was not warranted.  We thoroughly decompressed the cuff.  After bursectomy and hemostasis, we carefully inspected the cuff and found some areas of partial degeneration but no evidence of a retracted full-thickness tear.  Given these circumstances in my judgment, it is in Jacob Wells's best interest to rapidly rehabilitate his shoulder with early range of motion exercises at age 28.  After hemostasis was achieved, photographic documentation was obtained, followed by removal of the arthroscopic equipment.  The portals were repaired with mattress suture of 3-0 Prolene.  There were no apparent complications.  For aftercare, Jacob Wells will be provided prescriptions for Dilaudid 2 mg 1 or 2 tablets p.o. q.4 hours p.r.n. pain, 30 tablets without refill; also Keflex 500 mg 1 p.o. q.8 hours x4 days as a prophylactic antibiotic.     Katy Fitch Dvid Pendry, M.D.     RVS/MEDQ  D:  12/28/2013  T:  12/29/2013  Job:  [id number]

## 2013-12-30 ENCOUNTER — Encounter (HOSPITAL_BASED_OUTPATIENT_CLINIC_OR_DEPARTMENT_OTHER): Payer: Self-pay | Admitting: Orthopedic Surgery

## 2014-03-08 ENCOUNTER — Other Ambulatory Visit: Payer: Self-pay | Admitting: Orthopedic Surgery

## 2014-03-14 ENCOUNTER — Encounter (HOSPITAL_BASED_OUTPATIENT_CLINIC_OR_DEPARTMENT_OTHER): Payer: Self-pay | Admitting: *Deleted

## 2014-03-16 ENCOUNTER — Encounter (HOSPITAL_BASED_OUTPATIENT_CLINIC_OR_DEPARTMENT_OTHER)
Admission: RE | Admit: 2014-03-16 | Discharge: 2014-03-16 | Disposition: A | Discharge status: Home / Self Care | Payer: BC Managed Care – PPO | Source: Ambulatory Visit | Attending: Orthopedic Surgery | Admitting: Orthopedic Surgery

## 2014-03-16 LAB — BASIC METABOLIC PANEL
BUN: 22 mg/dL (ref 6–23)
CO2: 21 meq/L (ref 19–32)
Calcium: 9.5 mg/dL (ref 8.4–10.5)
Chloride: 104 mEq/L (ref 96–112)
Creatinine, Ser: 0.76 mg/dL (ref 0.50–1.35)
GFR calc Af Amer: >90 mL/min (ref >90)
GFR calc non Af Amer: >90 mL/min (ref >90)
GLUCOSE: 95 mg/dL (ref 70–99)
POTASSIUM: 4.7 meq/L (ref 3.7–5.3)
SODIUM: 140 meq/L (ref 137–147)

## 2014-03-16 NOTE — H&P (Signed)
Jacob Wells is an 63 y.o. male.   Chief Complaint: c/o chronic pain right index finger DIP joint HPI: He wanted to discuss his right index finger DIP joint.  He had a prior mucoid cyst excised. He is having increasing pain and an extensor lag develop at his right index finger DIP joint.  He has a history of MRSA of the right index finger previously.    Past Medical History  Diagnosis Date  . Hypertension   . GERD (gastroesophageal reflux disease)   . Barrett esophagus   . Seasonal allergies   . Insomnia   . Depression   . Arthritis     Past Surgical History  Procedure Laterality Date  . Finger debridement  2014    I/D right index MRSA  . Elbow arthroscopy  1995    right  . Bunionectomy      both feet  . Colonoscopy    . Upper gi endoscopy    . Shoulder arthroscopy with biceps tendon repair Left 12/28/2013    Procedure: SHOULDER ARTHROSCOPY WITH SUBACROMIAL DECOMPRESSION,DISTAL CLAVICLE RESECTION AND SUBSCAPULARIS REPAIR;  Surgeon: Cammie Sickle., MD;  Location: [facility];  Service: Orthopedics;  Laterality: Left;    History reviewed. No pertinent family history. Social History:  reports that he has never smoked. He does not have any smokeless tobacco history on file. He reports that he does not drink alcohol or use illicit drugs.  Allergies: No Known Allergies  No prescriptions prior to admission    Results for orders placed during the hospital encounter of 03/17/14 (from the past 48 hour(s))  BASIC METABOLIC PANEL     Status: None   Collection Time    03/16/14 11:00 AM      Result Value Ref Range   Sodium 140  137 - 147 mEq/L   Potassium 4.7  3.7 - 5.3 mEq/L   Chloride 104  96 - 112 mEq/L   CO2 21  19 - 32 mEq/L   Glucose, Bld 95  70 - 99 mg/dL   BUN 22  6 - 23 mg/dL   Creatinine, Ser 0.76  0.50 - 1.35 mg/dL   Calcium 9.5  8.4 - 10.5 mg/dL   GFR calc non Af Amer >90  >90 mL/min   GFR calc Af Amer >90  >90 mL/min   Comment: (NOTE)   The eGFR has been calculated using the CKD EPI equation.     This calculation has not been validated in all clinical situations.     eGFR's persistently <90 mL/min signify possible Chronic Kidney     Disease.    No results found.   Pertinent items are noted in HPI.  Height 6' (1.829 m), weight 91.627 kg (202 lb).  General appearance: alert Head: Normocephalic, without obvious abnormality Neck: supple, symmetrical, trachea midline Resp: clear to auscultation bilaterally Cardio: regular rate and rhythm GI: normal findings: bowel sounds normal Extremities:. Mr. 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 He is noted to have near bone on bone arthropathy. He is functional but notes some impairment of grip and pinch prehension due to pain at the DIP joint.    X-rays of his right index finger, four views, demonstrate bone-on-bone arthropathy.   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  Jacob Wells 03/16/2014, 4:09 PM  H&P documentation: 03/17/2014  -History and Physical Reviewed  -Patient has been re-examined  -No change in the plan of care  Cammie Sickle, MD

## 2014-03-17 ENCOUNTER — Encounter (HOSPITAL_BASED_OUTPATIENT_CLINIC_OR_DEPARTMENT_OTHER): Payer: Self-pay

## 2014-03-17 ENCOUNTER — Ambulatory Visit (HOSPITAL_BASED_OUTPATIENT_CLINIC_OR_DEPARTMENT_OTHER): Payer: BC Managed Care – PPO | Admitting: Anesthesiology

## 2014-03-17 ENCOUNTER — Encounter (HOSPITAL_BASED_OUTPATIENT_CLINIC_OR_DEPARTMENT_OTHER): Payer: BC Managed Care – PPO | Admitting: Anesthesiology

## 2014-03-17 ENCOUNTER — Ambulatory Visit (HOSPITAL_BASED_OUTPATIENT_CLINIC_OR_DEPARTMENT_OTHER)
Admission: RE | Admit: 2014-03-17 | Discharge: 2014-03-17 | Disposition: A | Discharge status: Home / Self Care | Payer: BC Managed Care – PPO | Source: Ambulatory Visit | Attending: Orthopedic Surgery | Admitting: Orthopedic Surgery

## 2014-03-17 ENCOUNTER — Encounter (HOSPITAL_BASED_OUTPATIENT_CLINIC_OR_DEPARTMENT_OTHER)
Admission: RE | Disposition: A | Discharge status: Home / Self Care | Payer: Self-pay | Source: Ambulatory Visit | Attending: Orthopedic Surgery

## 2014-03-17 DIAGNOSIS — K219 Gastro-esophageal reflux disease without esophagitis: Secondary | ICD-10-CM | POA: Insufficient documentation

## 2014-03-17 DIAGNOSIS — J301 Allergic rhinitis due to pollen: Secondary | ICD-10-CM | POA: Insufficient documentation

## 2014-03-17 DIAGNOSIS — G4700 Insomnia, unspecified: Secondary | ICD-10-CM | POA: Insufficient documentation

## 2014-03-17 DIAGNOSIS — F3289 Other specified depressive episodes: Secondary | ICD-10-CM | POA: Insufficient documentation

## 2014-03-17 DIAGNOSIS — M129 Arthropathy, unspecified: Secondary | ICD-10-CM | POA: Insufficient documentation

## 2014-03-17 DIAGNOSIS — Z8614 Personal history of Methicillin resistant Staphylococcus aureus infection: Secondary | ICD-10-CM | POA: Insufficient documentation

## 2014-03-17 DIAGNOSIS — I10 Essential (primary) hypertension: Secondary | ICD-10-CM | POA: Insufficient documentation

## 2014-03-17 DIAGNOSIS — Z9889 Other specified postprocedural states: Secondary | ICD-10-CM | POA: Insufficient documentation

## 2014-03-17 DIAGNOSIS — F329 Major depressive disorder, single episode, unspecified: Secondary | ICD-10-CM | POA: Insufficient documentation

## 2014-03-17 DIAGNOSIS — M19049 Primary osteoarthritis, unspecified hand: Secondary | ICD-10-CM | POA: Insufficient documentation

## 2014-03-17 DIAGNOSIS — K2270 Barrett's esophagus without dysplasia: Secondary | ICD-10-CM | POA: Insufficient documentation

## 2014-03-17 HISTORY — PX: PROXIMAL INTERPHALANGEAL FUSION (PIP): SHX6043

## 2014-03-17 LAB — POCT HEMOGLOBIN-HEMACUE: HEMOGLOBIN: 14.6 g/dL (ref 13.0–17.0)

## 2014-03-17 SURGERY — FUSION, PIP JOINT
Anesthesia: General | Site: Finger | Laterality: Right

## 2014-03-17 MED ORDER — MIDAZOLAM HCL 5 MG/5ML IJ SOLN
INTRAMUSCULAR | Status: DC | PRN
  Administered 2014-03-17: 2 mg via INTRAVENOUS

## 2014-03-17 MED ORDER — HYDROMORPHONE HCL PF 1 MG/ML IJ SOLN
INTRAMUSCULAR | Status: AC
  Filled 2014-03-17: qty 1

## 2014-03-17 MED ORDER — OXYCODONE HCL 5 MG PO TABS
5.0000 mg | ORAL_TABLET | Freq: Once | ORAL | Status: AC | PRN
  Administered 2014-03-17: 5 mg via ORAL

## 2014-03-17 MED ORDER — MIDAZOLAM HCL 2 MG/2ML IJ SOLN: 1.0000 mg | INTRAMUSCULAR | Status: DC | PRN

## 2014-03-17 MED ORDER — FENTANYL CITRATE 0.05 MG/ML IJ SOLN: 50.0000 ug | Freq: Once | INTRAMUSCULAR | Status: DC

## 2014-03-17 MED ORDER — OXYCODONE HCL 5 MG/5ML PO SOLN: 5.0000 mg | Freq: Once | ORAL | Status: AC | PRN

## 2014-03-17 MED ORDER — HYDROMORPHONE HCL 2 MG PO TABS: 2.0000 mg | ORAL_TABLET | ORAL | Status: AC | PRN

## 2014-03-17 MED ORDER — FENTANYL CITRATE 0.05 MG/ML IJ SOLN
INTRAMUSCULAR | Status: AC
  Filled 2014-03-17: qty 6

## 2014-03-17 MED ORDER — DEXAMETHASONE SODIUM PHOSPHATE 10 MG/ML IJ SOLN
INTRAMUSCULAR | Status: DC | PRN
  Administered 2014-03-17: 10 mg via INTRAVENOUS

## 2014-03-17 MED ORDER — MIDAZOLAM HCL 2 MG/2ML IJ SOLN
INTRAMUSCULAR | Status: AC
  Filled 2014-03-17: qty 2

## 2014-03-17 MED ORDER — FENTANYL CITRATE 0.05 MG/ML IJ SOLN: 50.0000 ug | INTRAMUSCULAR | Status: DC | PRN

## 2014-03-17 MED ORDER — LACTATED RINGERS IV SOLN
INTRAVENOUS | Status: DC
  Administered 2014-03-17 (×2): via INTRAVENOUS

## 2014-03-17 MED ORDER — CHLORHEXIDINE GLUCONATE 4 % EX LIQD: 60.0000 mL | Freq: Once | CUTANEOUS | Status: DC

## 2014-03-17 MED ORDER — LIDOCAINE HCL (CARDIAC) 20 MG/ML IV SOLN
INTRAVENOUS | Status: DC | PRN
  Administered 2014-03-17: 100 mg via INTRAVENOUS

## 2014-03-17 MED ORDER — HYDROMORPHONE HCL PF 1 MG/ML IJ SOLN
0.2500 mg | INTRAMUSCULAR | Status: DC | PRN
  Administered 2014-03-17 (×3): 0.5 mg via INTRAVENOUS

## 2014-03-17 MED ORDER — FENTANYL CITRATE 0.05 MG/ML IJ SOLN
INTRAMUSCULAR | Status: DC | PRN
  Administered 2014-03-17 (×3): 50 ug via INTRAVENOUS

## 2014-03-17 MED ORDER — CEPHALEXIN 500 MG PO CAPS: 500.0000 mg | ORAL_CAPSULE | Freq: Three times a day (TID) | ORAL | Status: AC

## 2014-03-17 MED ORDER — OXYCODONE HCL 5 MG PO TABS
ORAL_TABLET | ORAL | Status: AC
  Filled 2014-03-17: qty 1

## 2014-03-17 MED ORDER — ESMOLOL HCL 10 MG/ML IV SOLN
INTRAVENOUS | Status: DC | PRN
  Administered 2014-03-17: 10 mg via INTRAVENOUS

## 2014-03-17 MED ORDER — LIDOCAINE HCL 2 % IJ SOLN
INTRAMUSCULAR | Status: DC | PRN
  Administered 2014-03-17: 3.5 mL

## 2014-03-17 MED ORDER — PROMETHAZINE HCL 25 MG/ML IJ SOLN: 6.2500 mg | INTRAMUSCULAR | Status: DC | PRN

## 2014-03-17 MED ORDER — PROPOFOL 10 MG/ML IV BOLUS
INTRAVENOUS | Status: DC | PRN
  Administered 2014-03-17: 50 mg via INTRAVENOUS
  Administered 2014-03-17: 200 mg via INTRAVENOUS
  Administered 2014-03-17: 30 mg via INTRAVENOUS

## 2014-03-17 MED ORDER — PROPOFOL 10 MG/ML IV BOLUS
INTRAVENOUS | Status: AC
  Filled 2014-03-17: qty 20

## 2014-03-17 MED ORDER — ONDANSETRON HCL 4 MG/2ML IJ SOLN
INTRAMUSCULAR | Status: DC | PRN
  Administered 2014-03-17: 4 mg via INTRAVENOUS

## 2014-03-17 MED ORDER — CEFAZOLIN SODIUM-DEXTROSE 2-3 GM-% IV SOLR
INTRAVENOUS | Status: AC
  Filled 2014-03-17: qty 50

## 2014-03-17 MED ORDER — CEFAZOLIN SODIUM-DEXTROSE 2-3 GM-% IV SOLR
2.0000 g | INTRAVENOUS | Status: AC
  Administered 2014-03-17: 2 g via INTRAVENOUS

## 2014-03-17 SURGICAL SUPPLY — 73 items
BANDAGE ADH SHEER 1  50/CT (GAUZE/BANDAGES/DRESSINGS) IMPLANT
BANDAGE ELASTIC 3 VELCRO ST LF (GAUZE/BANDAGES/DRESSINGS) ×3 IMPLANT
BANDAGE ELASTIC 4 VELCRO ST LF (GAUZE/BANDAGES/DRESSINGS) IMPLANT
BIT DRILL MICR ACTRK 2 LNG PRF (BIT) ×1 IMPLANT
BLADE MINI RND TIP GREEN BEAV (BLADE) IMPLANT
BLADE SURG 15 STRL LF DISP TIS (BLADE) ×1 IMPLANT
BLADE SURG 15 STRL SS (BLADE) ×2
BNDG COHESIVE 1X5 TAN STRL LF (GAUZE/BANDAGES/DRESSINGS) IMPLANT
BNDG COHESIVE 3X5 TAN STRL LF (GAUZE/BANDAGES/DRESSINGS) IMPLANT
BNDG COHESIVE 4X5 TAN STRL (GAUZE/BANDAGES/DRESSINGS) IMPLANT
BNDG ELASTIC 2 VLCR STRL LF (GAUZE/BANDAGES/DRESSINGS) ×3 IMPLANT
BNDG ESMARK 4X9 LF (GAUZE/BANDAGES/DRESSINGS) ×3 IMPLANT
BNDG GAUZE ELAST 4 BULKY (GAUZE/BANDAGES/DRESSINGS) IMPLANT
BRUSH SCRUB EZ PLAIN DRY (MISCELLANEOUS) ×3 IMPLANT
BUR EGG 3PK/BX (BURR) ×3 IMPLANT
CANISTER SUCT 1200ML W/VALVE (MISCELLANEOUS) ×3 IMPLANT
CLOSURE WOUND 1/2 X4 (GAUZE/BANDAGES/DRESSINGS) ×1
CORDS BIPOLAR (ELECTRODE) ×3 IMPLANT
COVER MAYO STAND STRL (DRAPES) ×3 IMPLANT
COVER TABLE BACK 60X90 (DRAPES) ×3 IMPLANT
CUFF TOURNIQUET SINGLE 18IN (TOURNIQUET CUFF) ×3 IMPLANT
DECANTER SPIKE VIAL GLASS SM (MISCELLANEOUS) IMPLANT
DRAPE EXTREMITY T 121X128X90 (DRAPE) ×3 IMPLANT
DRAPE OEC MINIVIEW 54X84 (DRAPES) ×3 IMPLANT
DRAPE SURG 17X23 STRL (DRAPES) ×3 IMPLANT
DRILL MICRO ACUTRAK 2 LNG PROF (BIT) ×3
GLOVE BIOGEL M STRL SZ7.5 (GLOVE) ×3 IMPLANT
GLOVE BIOGEL PI IND STRL 7.0 (GLOVE) ×1 IMPLANT
GLOVE BIOGEL PI INDICATOR 7.0 (GLOVE) ×2
GLOVE ECLIPSE 6.5 STRL STRAW (GLOVE) ×3 IMPLANT
GLOVE ECLIPSE 7.0 STRL STRAW (GLOVE) ×3 IMPLANT
GLOVE ORTHO TXT STRL SZ7.5 (GLOVE) ×3 IMPLANT
GOWN STRL REUS W/ TWL LRG LVL3 (GOWN DISPOSABLE) ×1 IMPLANT
GOWN STRL REUS W/ TWL XL LVL3 (GOWN DISPOSABLE) ×2 IMPLANT
GOWN STRL REUS W/TWL LRG LVL3 (GOWN DISPOSABLE) ×2
GOWN STRL REUS W/TWL XL LVL3 (GOWN DISPOSABLE) ×4
GUIDEWIRE ORTHO MICROSHT  ACUT (WIRE) ×2
GUIDEWIRE ORTHO MICROSHT .035 (WIRE) ×1 IMPLANT
K-WIRE .035X4 (WIRE) ×3 IMPLANT
NEEDLE 27GAX1X1/2 (NEEDLE) ×3 IMPLANT
NS IRRIG 1000ML POUR BTL (IV SOLUTION) ×3 IMPLANT
PACK BASIN DAY SURGERY FS (CUSTOM PROCEDURE TRAY) ×3 IMPLANT
PAD CAST 3X4 CTTN HI CHSV (CAST SUPPLIES) IMPLANT
PAD CAST 4YDX4 CTTN HI CHSV (CAST SUPPLIES) IMPLANT
PADDING CAST ABS 4INX4YD NS (CAST SUPPLIES) ×2
PADDING CAST ABS COTTON 4X4 ST (CAST SUPPLIES) ×1 IMPLANT
PADDING CAST COTTON 3X4 STRL (CAST SUPPLIES)
PADDING CAST COTTON 4X4 STRL (CAST SUPPLIES)
PADDING CAST SYNTHETIC 3 NS LF (CAST SUPPLIES) ×2
PADDING CAST SYNTHETIC 3X4 NS (CAST SUPPLIES) ×1 IMPLANT
PADDING UNDERCAST 2  STERILE (CAST SUPPLIES) IMPLANT
SCREW ACUTRAK 2 MICRO 20MM (Screw) ×3 IMPLANT
SLEEVE SCD COMPRESS KNEE MED (MISCELLANEOUS) ×3 IMPLANT
SPLINT PLASTER CAST XFAST 3X15 (CAST SUPPLIES) ×20 IMPLANT
SPLINT PLASTER XTRA FASTSET 3X (CAST SUPPLIES) ×40
SPONGE GAUZE 4X4 12PLY (GAUZE/BANDAGES/DRESSINGS) ×3 IMPLANT
STOCKINETTE 4X48 STRL (DRAPES) ×3 IMPLANT
STRIP CLOSURE SKIN 1/2X4 (GAUZE/BANDAGES/DRESSINGS) ×2 IMPLANT
SUCTION FRAZIER TIP 10 FR DISP (SUCTIONS) IMPLANT
SUT ETHILON 4 0 PS 2 18 (SUTURE) IMPLANT
SUT MERSILENE 4 0 P 3 (SUTURE) ×3 IMPLANT
SUT PROLENE 3 0 PS 2 (SUTURE) IMPLANT
SUT PROLENE 4 0 P 3 18 (SUTURE) IMPLANT
SUT VIC AB 4-0 P-3 18XBRD (SUTURE) ×1 IMPLANT
SUT VIC AB 4-0 P3 18 (SUTURE) ×2
SYR 3ML 23GX1 SAFETY (SYRINGE) ×3 IMPLANT
SYR BULB 3OZ (MISCELLANEOUS) ×3 IMPLANT
SYR CONTROL 10ML LL (SYRINGE) ×3 IMPLANT
TOWEL OR 17X24 6PK STRL BLUE (TOWEL DISPOSABLE) ×3 IMPLANT
TRAY DSU PREP LF (CUSTOM PROCEDURE TRAY) ×3 IMPLANT
TUBE CONNECTING 20'X1/4 (TUBING)
TUBE CONNECTING 20X1/4 (TUBING) IMPLANT
UNDERPAD 30X30 INCONTINENT (UNDERPADS AND DIAPERS) ×3 IMPLANT

## 2014-03-17 NOTE — Anesthesia Postprocedure Evaluation (Signed)
  Anesthesia Post-op Note  Patient: Synetta Failndre L Fennel  Procedure(s) Performed: Procedure(s): FUSION OF RIGHT INDEX DISTAL INTERPHALANGEAL JOINT  (Right)  Patient Location: PACU  Anesthesia Type:General  Level of Consciousness: awake and alert   Airway and Oxygen Therapy: Patient Spontanous Breathing  Post-op Pain: mild  Post-op Assessment: Post-op Vital signs reviewed, Patient's Cardiovascular Status Stable, Respiratory Function Stable, Patent Airway, No signs of Nausea or vomiting and Pain level controlled  Post-op Vital Signs: Reviewed and stable  Last Vitals:  Filed Vitals:   03/17/14 1215  BP:   Pulse: 82  Temp:   Resp: 17    Complications: No apparent anesthesia complications

## 2014-03-17 NOTE — Brief Op Note (Signed)
03/17/2014  11:39 AM  PATIENT:  Synetta FailAndre L Stigler  63 y.o. male  PRE-OPERATIVE DIAGNOSIS:  DJD RIGHT INDEX DISTAL INTERPHALANGEAL JOINT   POST-OPERATIVE DIAGNOSIS:  djd right index distal interphangeal joint  PROCEDURE:  Procedure(s): FUSION OF RIGHT INDEX DISTAL INTERPHALANGEAL JOINT  (Right)  SURGEON:  Surgeon(s) and Role:    * Wyn Forsterobert V Glynn Yepes Jr., MD - Primary  PHYSICIAN ASSISTANT:   ASSISTANTS: Mallory Shirkobert Dasnoit,P.A-C   ANESTHESIA:   general  EBL:  Total I/O In: 200 [I.V.:200] Out: -   BLOOD ADMINISTERED:none  DRAINS: none   LOCAL MEDICATIONS USED:  XYLOCAINE   SPECIMEN:  No Specimen  DISPOSITION OF SPECIMEN:  N/A  COUNTS:  YES  TOURNIQUET:  * Missing tourniquet times found for documented tourniquets in log:  [id number] *  DICTATION: .Other Dictation: Dictation Number [id number]  PLAN OF CARE: Discharge to home after PACU  PATIENT DISPOSITION:  PACU - hemodynamically stable.   Delay start of Pharmacological VTE agent (>24hrs) due to surgical blood loss or risk of bleeding: not applicable

## 2014-03-17 NOTE — Discharge Instructions (Addendum)
[facility] Instructions Hand Surgery  Wound Care: Keep your hand elevated above the level of your heart.  Do not allow it to dangle by your side.  Keep the dressing dry and do not remove it unless your doctor advises you to do so.  He will usually change it at the time of your post-op visit.  Moving your fingers is advised to stimulate circulation but will depend on the site of your surgery.  If you have a splint applied, your doctor will advise you regarding movement.  Activity: Do not drive or operate machinery today.  Rest today and then you may return to your normal activity and work as indicated by your physician.  Diet:  Drink liquids today or eat a light diet.  You may resume a regular diet tomorrow.    General expectations: Pain for two to three days. Fingers may become slightly swollen.  Call your doctor if any of the following occur: Severe pain not relieved by pain medication. Elevated temperature. Dressing soaked with blood. Inability to move fingers. White or bluish color to fingers.  Do not remove the splint.  Elevate the right hand for 48 hours above your heart.   Post Anesthesia Home Care Instructions  Activity: Get plenty of rest for the remainder of the day. A responsible adult should stay with you for 24 hours following the procedure.  For the next 24 hours, DO NOT: -Drive a car -Advertising copywriterperate machinery -Drink alcoholic beverages -Take any medication unless instructed by your physician -Make any legal decisions or sign important papers.  Meals: Start with liquid foods such as gelatin or soup. Progress to regular foods as tolerated. Avoid greasy, spicy, heavy foods. If nausea and/or vomiting occur, drink only clear liquids until the nausea and/or vomiting subsides. Call your physician if vomiting continues.  Special Instructions/Symptoms: Your throat may feel dry or sore from the anesthesia or the breathing tube placed in your throat during surgery. If this  causes discomfort, gargle with warm salt water. The discomfort should disappear within 24 hours.

## 2014-03-17 NOTE — Transfer of Care (Signed)
Immediate Anesthesia Transfer of Care Note  Patient: Jacob Wells  Procedure(s) Performed: Procedure(s): FUSION OF RIGHT INDEX DISTAL INTERPHALANGEAL JOINT  (Right)  Patient Location: PACU  Anesthesia Type:General  Level of Consciousness: awake, alert  and oriented  Airway & Oxygen Therapy: Patient Spontanous Breathing and Patient connected to face mask oxygen  Post-op Assessment: Report given to PACU RN, Post -op Vital signs reviewed and stable and Patient moving all extremities  Post vital signs: Reviewed and stable  Complications: No apparent anesthesia complications

## 2014-03-17 NOTE — Anesthesia Procedure Notes (Signed)
Procedure Name: LMA Insertion Date/Time: 03/17/2014 10:51 AM Performed by: Curly ShoresRAFT, Marranda Arakelian W Pre-anesthesia Checklist: Patient identified, Emergency Drugs available, Suction available and Patient being monitored Patient Re-evaluated:Patient Re-evaluated prior to inductionOxygen Delivery Method: Circle System Utilized Preoxygenation: Pre-oxygenation with 100% oxygen Intubation Type: IV induction Ventilation: Mask ventilation without difficulty LMA: LMA inserted LMA Size: 5.0 Number of attempts: 1 Airway Equipment and Method: bite block Placement Confirmation: positive ETCO2 and breath sounds checked- equal and bilateral Tube secured with: Tape Dental Injury: Teeth and Oropharynx as per pre-operative assessment

## 2014-03-17 NOTE — Op Note (Signed)
[id number] 

## 2014-03-17 NOTE — Anesthesia Preprocedure Evaluation (Signed)
Anesthesia Evaluation  Patient identified by MRN, date of birth, ID band Patient awake    Reviewed: Allergy & Precautions, H&P , NPO status , Patient's Chart, lab work & pertinent test results  Airway Mallampati: I TM Distance: >3 FB Neck ROM: Full    Dental   Pulmonary  breath sounds clear to auscultation        Cardiovascular hypertension, Rhythm:Regular Rate:Normal     Neuro/Psych Depression    GI/Hepatic GERD-  ,  Endo/Other    Renal/GU      Musculoskeletal   Abdominal   Peds  Hematology   Anesthesia Other Findings   Reproductive/Obstetrics                           Anesthesia Physical Anesthesia Plan  ASA: II  Anesthesia Plan: General   Post-op Pain Management:    Induction: Intravenous  Airway Management Planned: LMA  Additional Equipment:   Intra-op Plan:   Post-operative Plan: Extubation in OR  Informed Consent: I have reviewed the patients History and Physical, chart, labs and discussed the procedure including the risks, benefits and alternatives for the proposed anesthesia with the patient or authorized representative who has indicated his/her understanding and acceptance.     Plan Discussed with: CRNA and Surgeon  Anesthesia Plan Comments:         Anesthesia Quick Evaluation

## 2014-03-18 NOTE — Op Note (Signed)
NAMEAIMEE, Wells              ACCOUNT NO.:  [id number]  MEDICAL RECORD NO.:  [id number]  LOCATION:                                 FACILITY:  PHYSICIAN:  Jacob Mighty. Allaina Brotzman, M.D.      DATE OF BIRTH:  DATE OF PROCEDURE:  03/17/2014 DATE OF DISCHARGE:                              OPERATIVE REPORT   PREOPERATIVE DIAGNOSIS:  End-stage painful degenerative arthritis, right index finger, distal interphalangeal joint following complex and complicated mucoid cyst treatment in [place], [place].  POSTOPERATIVE DIAGNOSIS:  End-stage painful degenerative arthritis, right index finger, distal interphalangeal joint following complex and complicated mucoid cyst treatment in [place], [place].  OPERATION:  Arthrodesis of right index finger distal interphalangeal joint, with 20-mm micro Acutrak screw fixation.  OPERATING SURGEON:  Jacob Wells, M.D.  ASSISTANT:  Jacob Lente Dasnoit, PA-C  ANESTHESIA:  General by LMA.  SUPERVISING ANESTHESIOLOGIST:  Jacob Wells.  INDICATION:  Jacob Wells is a 63 year old gentleman well acquainted with our practice.  He is currently undergoing rehabilitation of a right rotator cuff repair performed in January 2015.  During our care of his shoulder, he brought to my attention a very painful right index finger DIP joint.  He has had early degenerative arthritis of the DIP joint and had a mucoid cyst.  This has been treated in [facility] with debridement complicated by infection.  He subsequently had drainage and extensive antibiotic care.  He went on to develop a painful post infectious and degenerative arthritis.  We observed this for a period time to be certain there was no sign of deep infection.  His x- rays reveal complete destruction of the joint, however, he does still have a few degrees of motion with significant pain.  We advised Jacob Wells to proceed with an arthrodesis.  He returns to the operating at this time  anticipating arthrodesis of the distal interphalangeal joint of the right index finger with Acutrak screw fixation.  After informed consent, he was brought to the operating at this time.  Preoperatively, he was reminded of the risks and benefits of surgery. He is certainly at increased risk of postop infection due to his prior sepsis.  There are no active signs of infection at this time, however.  Preoperatively, he was interviewed by Jacob Wells of Anesthesia.  General anesthesia by LMA technique was recommended and accepted.  His right hand and index finger marked as the proper surgical site per protocol in the holding area.  He was transferred to room 1 of the [facility] placed in supine position on the operating table and under Dr. Osborne Oman direct supervision, general anesthesia by LMA technique induced.  The right hand and arm were prepped with Betadine soap and solution, sterilely draped.  A pneumatic tourniquet was applied to the proximal right brachium.  Following exsanguination of the right arm with Esmarch bandage, arterial tourniquet was inflated to 220 mmHg.  Following routine surgical time- out, procedure commenced with a lazy-S incision exposing the dorsum of the DIP joint and extensor mechanism.  The extensor was released 4 mm proximal to its insertion.  The joint was opened in shotgun style with resection  of the collateral ligaments.  In the head of the middle phalanx,there was noted to be a central area of granulation tissue likely was left over from his infectious episode.  This did not appear to be actively infected.  This was thoroughly debrided followed by creation of a cup and cone arthrodesis with a power bur.  We set the joint at less than 5 degrees of flexion, neutral angulation, and slight supination to allow pulp to pulp pinch with the thumb.  A 20-mm long micro Acutrak screw was placed with standard technique with retrograde placement of a 0.035  inch Kirschner wire through the distal phalanx and across the DIP joint into the base of the middle phalanx.  We then power reamed to 20 mm and placed a 20-mm Acutrak screw with standard technique.  Excellent compression of the articular surface was achieved.  AP, lateral, and oblique images were obtained confirming excellent position of the hardware and the surgical construct met our planned criteria for arthrodesis.  The wound was then repaired with multiple interrupted sutures of 4-0 Mersilene repairing the extensor mechanism.  The skin was repaired with intradermal 4-0 Prolene with Steri-Strips.  Jacob Wells was placed in a voluminous dressing.  Tourniquet was released with immediate capillary refill.  A 2% lidocaine was infiltrated at metacarpal head level for postoperative comfort.  For aftercare, he was placed in a compressive dressing.  He was encouraged to elevate his hand for the next 48 hours.  We will see him back for followup in a week for dressing change and advancement to a hand based splint.     Jacob Wells, M.D.     RVS/MEDQ  D:  03/17/2014  T:  03/18/2014  Job:  [id number]

## 2014-03-22 ENCOUNTER — Encounter (HOSPITAL_BASED_OUTPATIENT_CLINIC_OR_DEPARTMENT_OTHER): Payer: Self-pay | Admitting: Orthopedic Surgery
